# Patient Record
Sex: Male | Born: 2000 | Race: Black or African American | Hispanic: No | Marital: Single
Health system: Southern US, Community
[De-identification: ages and names within clinical notes are randomized; demographics above are authoritative.]

---
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
## 2022-05-24 ENCOUNTER — Ambulatory Visit: Payer: Self-pay

## 2023-03-12 ENCOUNTER — Emergency Department
Admission: EM | Admit: 2023-03-12 | Discharge: 2023-03-12 | Disposition: A | Discharge status: Home / Self Care | Payer: Commercial Managed Care - PPO | Attending: Emergency Medicine | Admitting: Emergency Medicine

## 2023-03-12 ENCOUNTER — Emergency Department: Payer: Commercial Managed Care - PPO

## 2023-03-12 ENCOUNTER — Other Ambulatory Visit: Payer: Self-pay

## 2023-03-12 DIAGNOSIS — S93401A Sprain of unspecified ligament of right ankle, initial encounter: Secondary | ICD-10-CM

## 2023-03-12 DIAGNOSIS — X501XXA Overexertion from prolonged static or awkward postures, initial encounter: Secondary | ICD-10-CM | POA: Diagnosis not present

## 2023-03-12 DIAGNOSIS — S92351A Displaced fracture of fifth metatarsal bone, right foot, initial encounter for closed fracture: Secondary | ICD-10-CM | POA: Diagnosis not present

## 2023-03-12 DIAGNOSIS — Y9367 Activity, basketball: Secondary | ICD-10-CM | POA: Insufficient documentation

## 2023-03-12 DIAGNOSIS — S99921A Unspecified injury of right foot, initial encounter: Secondary | ICD-10-CM | POA: Diagnosis present

## 2023-03-12 DIAGNOSIS — M7989 Other specified soft tissue disorders: Secondary | ICD-10-CM | POA: Diagnosis not present

## 2023-03-12 DIAGNOSIS — S92301A Fracture of unspecified metatarsal bone(s), right foot, initial encounter for closed fracture: Secondary | ICD-10-CM

## 2023-03-12 NOTE — ED Triage Notes (Signed)
Pt to ed from home via POV for ankle pain./ Pt was playing basketball when it started hurting. Pt denies any trauma or fall. Pt is caox4, in no acute distress in triage.

## 2023-03-12 NOTE — ED Provider Notes (Signed)
[facility] Provider Note    Event Date/Time   First MD Initiated Contact with Patient 03/12/23 1928     (approximate)   History   Ankle Pain (Right )   HPI  Hunter Ferrell is a 22 y.o. male with no PMH presents for evaluation of right ankle pain that began this morning.  Patient states he was playing basketball and went to get a ball from a ditch when he stepped in a hole.  Patient has had pain ever since and has been unable to bear weight.      Physical Exam   Triage Vital Signs: ED Triage Vitals  Enc Vitals Group     BP 03/12/23 1854 117/66     Pulse Rate 03/12/23 1854 72     Resp 03/12/23 1854 16     Temp 03/12/23 1854 98 F (36.7 C)     Temp Source 03/12/23 1854 Oral     SpO2 03/12/23 1854 99 %     Weight 03/12/23 1855 145 lb 8.1 oz (66 kg)     Height 03/12/23 1855 5\' 9"  (1.753 m)     Head Circumference --      Peak Flow --      Pain Score 03/12/23 1854 10     Pain Loc --      Pain Edu? --      Excl. in GC? --     Most recent vital signs: Vitals:   03/12/23 1854  BP: 117/66  Pulse: 72  Resp: 16  Temp: 98 F (36.7 C)  SpO2: 99%     General: Awake, no distress.  CV:  Good peripheral perfusion.  Resp:  Normal effort.  Abd:  No distention.  RLE:  Swelling of the right ankle when compared to the left, no appreciable bruising, TTP along the inferior medial malleolus and inferior lateral malleolus as well as at the base of the fifth metatarsal, ROM limited due to pain, neurovascularly intact, no tenderness to the fibular head.   ED Results / Procedures / Treatments   Labs (all labs ordered are listed, but only abnormal results are displayed) Labs Reviewed - No data to display   RADIOLOGY  Right ankle x-rays obtained in the ED today.  I interpreted the images as well as reviewed the radiologist report.   PROCEDURES:  Critical Care performed: No  Procedures   MEDICATIONS ORDERED IN ED: Medications - No data to  display   IMPRESSION / MDM / ASSESSMENT AND PLAN / ED COURSE  I reviewed the triage vital signs and the nursing notes.                             22 year old male presented for evaluation of right ankle pain.  Vital signs stable in triage, patient is in NAD, very tender at the left ankle.  Differential diagnosis includes, but is not limited to, ankle sprain, ankle fracture, tendon injury.  Patient's presentation is most consistent with acute complicated illness / injury requiring diagnostic workup.  Right ankle x-rays obtained, I interpreted the images as well as reviewed the radiologist report, which noted lateral soft tissue swelling small of the fracture or at the base of the fifth metatarsal.  Patient will be placed in a tall walking boot and given crutches.  Patient instructed to follow-up with orthopedics.  I explained that he can take Tylenol and ibuprofen for pain and should ice and  elevate his foot as well.  He is to not play basketball until seen by orthopedics.  Patient was agreeable to plan, voiced understanding and was stable at discharge.     FINAL CLINICAL IMPRESSION(S) / ED DIAGNOSES   Final diagnoses:  Sprain of right ankle, unspecified ligament, initial encounter  Closed avulsion fracture of metatarsal bone of right foot, initial encounter     Rx / DC Orders   ED Discharge Orders     None        Note:  This document was prepared using Dragon voice recognition software and may include unintentional dictation errors.   Cameron Ali, PA-C 03/12/23 2042    Chesley Noon, MD 03/12/23 2352

## 2023-03-12 NOTE — Discharge Instructions (Addendum)
X-rays show the you have an avulsion fracture of your fifth metatarsal on your right foot.  You also have a right ankle sprain.  Please wear the walking boot at all times aside from when you are in the shower.  Follow-up with orthopedics.  You can take Tylenol and ibuprofen for pain.

## 2024-03-02 ENCOUNTER — Emergency Department

## 2024-03-02 ENCOUNTER — Other Ambulatory Visit: Payer: Self-pay

## 2024-03-02 ENCOUNTER — Emergency Department
Admission: EM | Admit: 2024-03-02 | Discharge: 2024-03-02 | Disposition: A | Discharge status: Other Acute Inpatient Hospital | Attending: Emergency Medicine | Admitting: Emergency Medicine

## 2024-03-02 DIAGNOSIS — Y92410 Unspecified street and highway as the place of occurrence of the external cause: Secondary | ICD-10-CM | POA: Insufficient documentation

## 2024-03-02 DIAGNOSIS — Y904 Blood alcohol level of 80-99 mg/100 ml: Secondary | ICD-10-CM | POA: Diagnosis not present

## 2024-03-02 DIAGNOSIS — S02602A Fracture of unspecified part of body of left mandible, initial encounter for closed fracture: Secondary | ICD-10-CM | POA: Diagnosis not present

## 2024-03-02 DIAGNOSIS — S02600A Fracture of unspecified part of body of mandible, initial encounter for closed fracture: Secondary | ICD-10-CM

## 2024-03-02 DIAGNOSIS — S01511A Laceration without foreign body of lip, initial encounter: Secondary | ICD-10-CM | POA: Diagnosis not present

## 2024-03-02 DIAGNOSIS — R519 Headache, unspecified: Secondary | ICD-10-CM | POA: Diagnosis present

## 2024-03-02 LAB — CBC
HCT: 37.7 % — ABNORMAL LOW (ref 39.0–52.0)
Hemoglobin: 12.9 g/dL — ABNORMAL LOW (ref 13.0–17.0)
MCH: 28.8 pg (ref 26.0–34.0)
MCHC: 34.2 g/dL (ref 30.0–36.0)
MCV: 84.2 fL (ref 80.0–100.0)
Platelets: 209 10*3/uL (ref 150–400)
RBC: 4.48 MIL/uL (ref 4.22–5.81)
RDW: 13.3 % (ref 11.5–15.5)
WBC: 4.8 10*3/uL (ref 4.0–10.5)
nRBC: 0 % (ref 0.0–0.2)

## 2024-03-02 LAB — PROTIME-INR
INR: 1 (ref 0.8–1.2)
Prothrombin Time: 13.7 s (ref 11.4–15.2)

## 2024-03-02 LAB — SAMPLE TO BLOOD BANK

## 2024-03-02 LAB — COMPREHENSIVE METABOLIC PANEL WITH GFR
ALT: 18 U/L (ref 0–44)
AST: 32 U/L (ref 15–41)
Albumin: 4.3 g/dL (ref 3.5–5.0)
Alkaline Phosphatase: 52 U/L (ref 38–126)
Anion gap: 9 (ref 5–15)
BUN: 11 mg/dL (ref 6–20)
CO2: 24 mmol/L (ref 22–32)
Calcium: 8.9 mg/dL (ref 8.9–10.3)
Chloride: 104 mmol/L (ref 98–111)
Creatinine, Ser: 0.9 mg/dL (ref 0.61–1.24)
GFR, Estimated: >60 mL/min (ref >60)
Glucose, Bld: 90 mg/dL (ref 70–99)
Potassium: 3.6 mmol/L (ref 3.5–5.1)
Sodium: 137 mmol/L (ref 135–145)
Total Bilirubin: 1.2 mg/dL (ref 0.0–1.2)
Total Protein: 6.9 g/dL (ref 6.5–8.1)

## 2024-03-02 LAB — ETHANOL: Alcohol, Ethyl (B): 80 mg/dL — ABNORMAL HIGH (ref <15)

## 2024-03-02 MED ORDER — LIDOCAINE-PRILOCAINE 2.5-2.5 % EX CREA
TOPICAL_CREAM | Freq: Once | CUTANEOUS | Status: AC
  Filled 2024-03-02: qty 5

## 2024-03-02 MED ORDER — FENTANYL CITRATE PF 50 MCG/ML IJ SOSY
50.0000 ug | PREFILLED_SYRINGE | Freq: Once | INTRAMUSCULAR | Status: AC
  Administered 2024-03-02: 50 ug via INTRAVENOUS
  Filled 2024-03-02: qty 1

## 2024-03-02 MED ORDER — SODIUM CHLORIDE 0.9 % IV SOLN
3.0000 g | Freq: Once | INTRAVENOUS | Status: AC
  Administered 2024-03-02: 3 g via INTRAVENOUS
  Filled 2024-03-02: qty 8

## 2024-03-02 MED ORDER — SODIUM CHLORIDE 0.9 % IV BOLUS
1000.0000 mL | Freq: Once | INTRAVENOUS | Status: AC
  Administered 2024-03-02: 1000 mL via INTRAVENOUS

## 2024-03-02 MED ORDER — MORPHINE SULFATE (PF) 4 MG/ML IV SOLN
4.0000 mg | INTRAVENOUS | Status: DC | PRN
  Administered 2024-03-02: 4 mg via INTRAVENOUS
  Filled 2024-03-02: qty 1

## 2024-03-02 MED ORDER — SODIUM CHLORIDE 0.9 % IV SOLN: INTRAVENOUS | Status: DC

## 2024-03-02 MED ORDER — MORPHINE SULFATE (PF) 4 MG/ML IV SOLN
4.0000 mg | Freq: Once | INTRAVENOUS | Status: AC
  Administered 2024-03-02: 4 mg via INTRAVENOUS
  Filled 2024-03-02: qty 1

## 2024-03-02 MED ORDER — ONDANSETRON HCL 4 MG/2ML IJ SOLN
4.0000 mg | Freq: Four times a day (QID) | INTRAMUSCULAR | Status: DC | PRN
  Filled 2024-03-02: qty 2

## 2024-03-02 MED ORDER — LIDOCAINE HCL (PF) 1 % IJ SOLN
INTRAMUSCULAR | Status: AC
  Administered 2024-03-02: 5 mL via INTRADERMAL
  Filled 2024-03-02: qty 5

## 2024-03-02 MED ORDER — IOHEXOL 300 MG/ML  SOLN
100.0000 mL | Freq: Once | INTRAMUSCULAR | Status: AC | PRN
  Administered 2024-03-02: 100 mL via INTRAVENOUS

## 2024-03-02 MED ORDER — LIDOCAINE HCL (PF) 1 % IJ SOLN
5.0000 mL | Freq: Once | INTRAMUSCULAR | Status: AC
  Administered 2024-03-02: 5 mL via INTRADERMAL
  Filled 2024-03-02: qty 5

## 2024-03-02 MED ORDER — ONDANSETRON HCL 4 MG/2ML IJ SOLN
4.0000 mg | Freq: Once | INTRAMUSCULAR | Status: AC
  Administered 2024-03-02: 4 mg via INTRAVENOUS
  Filled 2024-03-02: qty 2

## 2024-03-02 MED ORDER — SODIUM CHLORIDE 0.9 % IV SOLN: 3.0000 g | Freq: Four times a day (QID) | INTRAVENOUS | Status: DC

## 2024-03-02 NOTE — ED Notes (Signed)
 Ward DO at bedside for laceration repair
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
## 2024-03-02 NOTE — ED Notes (Signed)
 Ward DO made aware pt c/o mouth pain 10/10. Ward DO also made aware pt very drowsy a this time.
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
## 2024-03-07 MED FILL — Fentanyl Citrate Preservative Free (PF) Inj 100 MCG/2ML: INTRAMUSCULAR | Qty: 2 | Status: AC
